# Patient Record
Sex: Female | Born: 1965 | Race: White | Hispanic: No | Smoking: Never smoker
Health system: Southern US, Community
[De-identification: ages and names within clinical notes are randomized; demographics above are authoritative.]

## PROBLEM LIST (undated history)

## (undated) DIAGNOSIS — E079 Disorder of thyroid, unspecified: Secondary | ICD-10-CM

## (undated) DIAGNOSIS — G43909 Migraine, unspecified, not intractable, without status migrainosus: Secondary | ICD-10-CM

## (undated) DIAGNOSIS — E039 Hypothyroidism, unspecified: Secondary | ICD-10-CM

## (undated) HISTORY — PX: PATENT DUCTUS ARTERIOUS REPAIR: SHX269

## (undated) HISTORY — PX: ABDOMINAL HYSTERECTOMY: SHX81

## (undated) HISTORY — PX: ABDOMINAL SURGERY: SHX537

## (undated) HISTORY — PX: HAND SURGERY: SHX662

---

## 2008-06-23 ENCOUNTER — Emergency Department (HOSPITAL_COMMUNITY): Admission: EM | Admit: 2008-06-23 | Discharge: 2008-06-23 | Payer: Self-pay | Admitting: Emergency Medicine

## 2008-07-28 ENCOUNTER — Ambulatory Visit: Payer: Self-pay | Admitting: Internal Medicine

## 2008-12-14 ENCOUNTER — Emergency Department: Payer: Self-pay | Admitting: Emergency Medicine

## 2009-07-29 ENCOUNTER — Ambulatory Visit: Payer: Self-pay | Admitting: Internal Medicine

## 2010-02-23 ENCOUNTER — Ambulatory Visit: Payer: Self-pay | Admitting: Internal Medicine

## 2010-03-01 ENCOUNTER — Ambulatory Visit: Payer: Self-pay | Admitting: Internal Medicine

## 2010-03-27 ENCOUNTER — Ambulatory Visit: Payer: Self-pay | Admitting: Internal Medicine

## 2010-06-17 ENCOUNTER — Emergency Department: Payer: Self-pay | Admitting: Emergency Medicine

## 2010-06-28 ENCOUNTER — Emergency Department (HOSPITAL_COMMUNITY): Admission: EM | Admit: 2010-06-28 | Discharge: 2010-06-28 | Payer: Self-pay | Admitting: Emergency Medicine

## 2010-11-09 ENCOUNTER — Ambulatory Visit: Payer: Self-pay | Admitting: Obstetrics and Gynecology

## 2011-05-06 ENCOUNTER — Emergency Department: Payer: Self-pay | Admitting: Emergency Medicine

## 2011-09-27 LAB — CBC
MCHC: 34.3
MCV: 89.7
Platelets: 269
RDW: 14.6

## 2011-09-27 LAB — DIFFERENTIAL
Basophils Relative: 1
Eosinophils Absolute: 0.1
Eosinophils Relative: 1
Neutrophils Relative %: 62

## 2011-09-27 LAB — BASIC METABOLIC PANEL
BUN: 10
CO2: 23
Chloride: 108
Creatinine, Ser: 0.74
Glucose, Bld: 100 — ABNORMAL HIGH

## 2011-10-14 ENCOUNTER — Emergency Department (HOSPITAL_COMMUNITY)
Admission: EM | Admit: 2011-10-14 | Discharge: 2011-10-14 | Disposition: A | Discharge status: Home / Self Care | Attending: Emergency Medicine | Admitting: Emergency Medicine

## 2011-10-14 ENCOUNTER — Encounter: Payer: Self-pay | Admitting: *Deleted

## 2011-10-14 DIAGNOSIS — E079 Disorder of thyroid, unspecified: Secondary | ICD-10-CM | POA: Insufficient documentation

## 2011-10-14 DIAGNOSIS — T63441A Toxic effect of venom of bees, accidental (unintentional), initial encounter: Secondary | ICD-10-CM

## 2011-10-14 DIAGNOSIS — G43909 Migraine, unspecified, not intractable, without status migrainosus: Secondary | ICD-10-CM | POA: Insufficient documentation

## 2011-10-14 DIAGNOSIS — T6391XA Toxic effect of contact with unspecified venomous animal, accidental (unintentional), initial encounter: Secondary | ICD-10-CM | POA: Insufficient documentation

## 2011-10-14 DIAGNOSIS — T63461A Toxic effect of venom of wasps, accidental (unintentional), initial encounter: Secondary | ICD-10-CM | POA: Insufficient documentation

## 2011-10-14 HISTORY — DX: Disorder of thyroid, unspecified: E07.9

## 2011-10-14 HISTORY — DX: Migraine, unspecified, not intractable, without status migrainosus: G43.909

## 2011-10-14 MED ORDER — PREDNISONE 20 MG PO TABS: 60.0000 mg | ORAL_TABLET | Freq: Every day | ORAL | Status: AC

## 2011-10-14 MED ORDER — EPINEPHRINE 0.3 MG/0.3ML IJ DEVI: 0.3000 mg | INTRAMUSCULAR | Status: AC | PRN

## 2011-10-14 MED ORDER — PREDNISONE 20 MG PO TABS
60.0000 mg | ORAL_TABLET | Freq: Once | ORAL | Status: AC
  Administered 2011-10-14: 60 mg via ORAL
  Filled 2011-10-14: qty 3

## 2011-10-14 NOTE — ED Notes (Signed)
Pt states that she was stung on her right middle finger by a yellow jacket at approximately 1510. Pt gave herself an epipen injection and Benadryl 50 mg po. Pt states that she is having a little tightness in her chest and pain in her finger. Denies difficulty breathing or swallowing.

## 2011-10-14 NOTE — ED Notes (Signed)
Pt a/ox4. Resp even and unlabored. NAD at this time. D/C instructions reviewed with pt. Pt verbalized understanding. Pt ambulated to POV.

## 2011-10-14 NOTE — ED Notes (Signed)
Pt alert and oriented x 3. Skin warm and dry. Color pink. Breath sounds clear and equal bilaterally. Abdomen soft and non distended. C/o tingling in her face and pain and swelling in her right middle finger.

## 2011-10-14 NOTE — ED Provider Notes (Signed)
History  Scribed for Dr. Rubin Payor, the patient was seen in room APA18. The chart was scribed by Gilman Schmidt. The patients care was started at 1630 CSN: [id number] Arrival date & time: 10/14/2011  4:08 PM  Chief Complaint  Patient presents with  . Allergic Reaction    HPI Bridget Fisher is a 45 y.o. female who presents to the Emergency Department complaining of allergic reaction. Pt states she was stung on right middle finger by a yellow jacket approximately 1510. Pt gave herself an epipen injection and Benadryl 50 mg po. Pt states that she is having a little tightness in chest and pain in fingers. Denies difficulty breathing or swallowing. Pt has never been admitted for allergic reaction. States symptoms have alleviated. Denies any heart problems. There are no other associated symptoms and no other alleviating or aggravating factors.   Past Medical History  Diagnosis Date  . Thyroid disease   . Migraines     Past Surgical History  Procedure Date  . Patent ductus arterious repair   . Cesarean section     x 3  . Abdominal hysterectomy   . Abdominal surgery   . Hand surgery     left    History reviewed. No pertinent family history.  History  Substance Use Topics  . Smoking status: Never Smoker   . Smokeless tobacco: Not on file  . Alcohol Use: No    OB History    Grav Para Term Preterm Abortions TAB SAB Ect Mult Living                  Review of Systems  HENT: Negative for trouble swallowing.   Respiratory: Positive for chest tightness. Negative for shortness of breath and wheezing.   Cardiovascular: Negative for chest pain.  Musculoskeletal:       Finger Pain    Allergies  Iohexol; Other; and Sumatriptan  Home Medications  No current outpatient prescriptions on file.  BP 110/56  Pulse 73  Temp(Src) 98.8 F (37.1 C) (Oral)  Resp 16  Ht 5\' 7"  (1.702 m)  Wt 195 lb (88.451 kg)  BMI 30.54 kg/m2  SpO2 97%  Physical Exam  Constitutional: She is oriented to  person, place, and time. She appears well-developed and well-nourished.  Non-toxic appearance. She does not have a sickly appearance.  HENT:  Head: Normocephalic and atraumatic.  Mouth/Throat: No uvula swelling.  Eyes: Conjunctivae, EOM and lids are normal. Pupils are equal, round, and reactive to light. No scleral icterus.  Neck: Trachea normal and normal range of motion. Neck supple.  Cardiovascular: Regular rhythm and normal heart sounds.   Pulmonary/Chest: Effort normal and breath sounds normal.  Abdominal: Soft. Normal appearance. There is no tenderness. There is no rebound, no guarding and no CVA tenderness.  Musculoskeletal: Normal range of motion.       Sting on right medial middle phalange w/ mild edema   Neurological: She is alert and oriented to person, place, and time. She has normal strength.  Skin: Skin is warm, dry and intact. No rash noted.    ED Course  Procedures  DIAGNOSTIC STUDIES: Oxygen Saturation is 97% on room air, normal by my interpretation.    COORDINATION OF CARE: 1630:  - Patient evaluated by ED physician, Deltasone ordered     MDM  Patient came in after an insect staying with an allergic reaction. She has a history of similar allergic reaction to pass. She gave herself an injection with her EpiPen and 50 mg  of Benadryl by mouth for her shortness of breath. The staying at about 3 PM. She feels better on reexamination. No dyspnea I will discharge her with prednisone, and her refill for her EpiPen's.  I personally performed the services described in this documentation, which was scribed in my presence. The recorded information has been reviewed and considered. Juliet Rude. Rubin Payor, MD       Juliet Rude. Rubin Payor, MD 10/14/11 1731

## 2011-11-13 ENCOUNTER — Ambulatory Visit: Payer: Self-pay | Admitting: Internal Medicine

## 2012-01-24 ENCOUNTER — Ambulatory Visit: Payer: Self-pay | Admitting: General Practice

## 2012-03-04 ENCOUNTER — Encounter (HOSPITAL_BASED_OUTPATIENT_CLINIC_OR_DEPARTMENT_OTHER): Payer: Self-pay | Admitting: *Deleted

## 2012-03-10 ENCOUNTER — Ambulatory Visit (HOSPITAL_BASED_OUTPATIENT_CLINIC_OR_DEPARTMENT_OTHER): Payer: Worker's Compensation | Admitting: Anesthesiology

## 2012-03-10 ENCOUNTER — Encounter (HOSPITAL_BASED_OUTPATIENT_CLINIC_OR_DEPARTMENT_OTHER): Payer: Self-pay | Admitting: Anesthesiology

## 2012-03-10 ENCOUNTER — Encounter (HOSPITAL_BASED_OUTPATIENT_CLINIC_OR_DEPARTMENT_OTHER): Payer: Self-pay

## 2012-03-10 ENCOUNTER — Encounter (HOSPITAL_BASED_OUTPATIENT_CLINIC_OR_DEPARTMENT_OTHER): Payer: Self-pay | Admitting: Orthopedic Surgery

## 2012-03-10 ENCOUNTER — Encounter (HOSPITAL_BASED_OUTPATIENT_CLINIC_OR_DEPARTMENT_OTHER)
Admission: RE | Disposition: A | Discharge status: Home / Self Care | Payer: Self-pay | Source: Ambulatory Visit | Attending: Orthopedic Surgery

## 2012-03-10 ENCOUNTER — Ambulatory Visit (HOSPITAL_BASED_OUTPATIENT_CLINIC_OR_DEPARTMENT_OTHER)
Admission: RE | Admit: 2012-03-10 | Discharge: 2012-03-10 | Disposition: A | Discharge status: Home / Self Care | Payer: Worker's Compensation | Source: Ambulatory Visit | Attending: Orthopedic Surgery | Admitting: Orthopedic Surgery

## 2012-03-10 DIAGNOSIS — M23329 Other meniscus derangements, posterior horn of medial meniscus, unspecified knee: Secondary | ICD-10-CM

## 2012-03-10 DIAGNOSIS — M129 Arthropathy, unspecified: Secondary | ICD-10-CM | POA: Insufficient documentation

## 2012-03-10 DIAGNOSIS — E039 Hypothyroidism, unspecified: Secondary | ICD-10-CM | POA: Insufficient documentation

## 2012-03-10 DIAGNOSIS — J45909 Unspecified asthma, uncomplicated: Secondary | ICD-10-CM | POA: Insufficient documentation

## 2012-03-10 DIAGNOSIS — R51 Headache: Secondary | ICD-10-CM | POA: Insufficient documentation

## 2012-03-10 DIAGNOSIS — Z01812 Encounter for preprocedural laboratory examination: Secondary | ICD-10-CM | POA: Insufficient documentation

## 2012-03-10 DIAGNOSIS — M2240 Chondromalacia patellae, unspecified knee: Secondary | ICD-10-CM | POA: Insufficient documentation

## 2012-03-10 DIAGNOSIS — M23359 Other meniscus derangements, posterior horn of lateral meniscus, unspecified knee: Secondary | ICD-10-CM | POA: Insufficient documentation

## 2012-03-10 HISTORY — PX: MENISECTOMY: SHX5181

## 2012-03-10 HISTORY — PX: CHONDROPLASTY: SHX5177

## 2012-03-10 HISTORY — DX: Hypothyroidism, unspecified: E03.9

## 2012-03-10 LAB — POCT HEMOGLOBIN-HEMACUE: Hemoglobin: 13.5 g/dL (ref 12.0–15.0)

## 2012-03-10 SURGERY — ARTHROSCOPY, KNEE, WITH MEDIAL MENISCECTOMY
Anesthesia: General | Site: Knee | Laterality: Right | Wound class: Clean

## 2012-03-10 MED ORDER — LIDOCAINE HCL (CARDIAC) 20 MG/ML IV SOLN
INTRAVENOUS | Status: DC | PRN
  Administered 2012-03-10: 40 mg via INTRAVENOUS

## 2012-03-10 MED ORDER — SODIUM CHLORIDE 0.9 % IR SOLN
Status: DC | PRN
  Administered 2012-03-10: 14:00:00

## 2012-03-10 MED ORDER — DEXAMETHASONE SODIUM PHOSPHATE 4 MG/ML IJ SOLN
INTRAMUSCULAR | Status: DC | PRN
  Administered 2012-03-10: 10 mg via INTRAVENOUS

## 2012-03-10 MED ORDER — HYDROCODONE-ACETAMINOPHEN 5-325 MG PO TABS: 1.0000 | ORAL_TABLET | ORAL | Status: DC | PRN

## 2012-03-10 MED ORDER — HYDROMORPHONE HCL PF 1 MG/ML IJ SOLN
0.2500 mg | INTRAMUSCULAR | Status: DC | PRN
  Administered 2012-03-10 (×2): 0.5 mg via INTRAVENOUS

## 2012-03-10 MED ORDER — SUCCINYLCHOLINE CHLORIDE 20 MG/ML IJ SOLN
INTRAMUSCULAR | Status: DC | PRN
  Administered 2012-03-10: 100 mg via INTRAVENOUS

## 2012-03-10 MED ORDER — PROPOFOL 10 MG/ML IV EMUL
INTRAVENOUS | Status: DC | PRN
  Administered 2012-03-10: 50 mg via INTRAVENOUS
  Administered 2012-03-10: 200 mg via INTRAVENOUS

## 2012-03-10 MED ORDER — MIDAZOLAM HCL 5 MG/5ML IJ SOLN
INTRAMUSCULAR | Status: DC | PRN
  Administered 2012-03-10: 2 mg via INTRAVENOUS

## 2012-03-10 MED ORDER — LACTATED RINGERS IV SOLN
INTRAVENOUS | Status: DC
  Administered 2012-03-10 (×2): via INTRAVENOUS

## 2012-03-10 MED ORDER — BUPIVACAINE-EPINEPHRINE 0.5% -1:200000 IJ SOLN
INTRAMUSCULAR | Status: DC | PRN
  Administered 2012-03-10: 20 mL

## 2012-03-10 MED ORDER — ONDANSETRON HCL 4 MG/2ML IJ SOLN
INTRAMUSCULAR | Status: DC | PRN
  Administered 2012-03-10: 4 mg via INTRAVENOUS

## 2012-03-10 MED ORDER — FENTANYL CITRATE 0.05 MG/ML IJ SOLN
INTRAMUSCULAR | Status: DC | PRN
  Administered 2012-03-10 (×3): 50 ug via INTRAVENOUS

## 2012-03-10 MED ORDER — HYDROCODONE-ACETAMINOPHEN 5-325 MG PO TABS: 1.0000 | ORAL_TABLET | ORAL | Status: AC | PRN

## 2012-03-10 MED ORDER — CEFAZOLIN SODIUM 1-5 GM-% IV SOLN
INTRAVENOUS | Status: DC | PRN
  Administered 2012-03-10: 2 g via INTRAVENOUS

## 2012-03-10 SURGICAL SUPPLY — 39 items
BANDAGE ELASTIC 6 VELCRO ST LF (GAUZE/BANDAGES/DRESSINGS) ×2 IMPLANT
BLADE 4.2CUDA (BLADE) IMPLANT
BLADE CUTTER GATOR 3.5 (BLADE) ×1 IMPLANT
BLADE GREAT WHITE 4.2 (BLADE) IMPLANT
CANISTER OMNI JUG 16 LITER (MISCELLANEOUS) ×1 IMPLANT
CANISTER SUCTION 2500CC (MISCELLANEOUS) IMPLANT
CHLORAPREP W/TINT 26ML (MISCELLANEOUS) ×2 IMPLANT
CLOTH BEACON ORANGE TIMEOUT ST (SAFETY) ×2 IMPLANT
DRAPE ARTHROSCOPY W/POUCH 114 (DRAPES) ×2 IMPLANT
ELECT MENISCUS 165MM 90D (ELECTRODE) IMPLANT
ELECT REM PT RETURN 9FT ADLT (ELECTROSURGICAL) ×2
ELECTRODE REM PT RTRN 9FT ADLT (ELECTROSURGICAL) IMPLANT
GAUZE XEROFORM 1X8 LF (GAUZE/BANDAGES/DRESSINGS) ×2 IMPLANT
GLOVE BIO SURGEON STRL SZ 6.5 (GLOVE) ×1 IMPLANT
GLOVE BIO SURGEON STRL SZ7 (GLOVE) ×2 IMPLANT
GLOVE BIO SURGEON STRL SZ7.5 (GLOVE) ×2 IMPLANT
GLOVE BIOGEL PI IND STRL 7.0 (GLOVE) ×1 IMPLANT
GLOVE BIOGEL PI IND STRL 8 (GLOVE) ×1 IMPLANT
GLOVE BIOGEL PI INDICATOR 7.0 (GLOVE) ×2
GLOVE BIOGEL PI INDICATOR 8 (GLOVE) ×1
GOWN PREVENTION PLUS XLARGE (GOWN DISPOSABLE) ×4 IMPLANT
KNEE WRAP E Z 3 GEL PACK (MISCELLANEOUS) ×2 IMPLANT
NDL FILTER BLUNT 18X1 1/2 (NEEDLE) ×1 IMPLANT
NDL SAFETY ECLIPSE 18X1.5 (NEEDLE) IMPLANT
NEEDLE FILTER BLUNT 18X 1/2SAF (NEEDLE)
NEEDLE FILTER BLUNT 18X1 1/2 (NEEDLE) IMPLANT
NEEDLE HYPO 18GX1.5 SHARP (NEEDLE)
PACK ARTHROSCOPY DSU (CUSTOM PROCEDURE TRAY) ×2 IMPLANT
PACK BASIN DAY SURGERY FS (CUSTOM PROCEDURE TRAY) ×2 IMPLANT
PENCIL BUTTON HOLSTER BLD 10FT (ELECTRODE) IMPLANT
SET ARTHROSCOPY TUBING (MISCELLANEOUS) ×2
SET ARTHROSCOPY TUBING LN (MISCELLANEOUS) ×1 IMPLANT
SLEEVE SCD COMPRESS KNEE MED (MISCELLANEOUS) ×1 IMPLANT
SPONGE GAUZE 4X4 12PLY (GAUZE/BANDAGES/DRESSINGS) ×2 IMPLANT
SYR 3ML 18GX1 1/2 (SYRINGE) ×1 IMPLANT
SYR 5ML LL (SYRINGE) ×1 IMPLANT
TOWEL OR 17X24 6PK STRL BLUE (TOWEL DISPOSABLE) ×2 IMPLANT
WAND STAR VAC 90 (SURGICAL WAND) IMPLANT
WATER STERILE IRR 1000ML POUR (IV SOLUTION) ×2 IMPLANT

## 2012-03-10 NOTE — Discharge Instructions (Addendum)
Post Anesthesia Home Care Instructions  Activity: Get plenty of rest for the remainder of the day. A responsible adult should stay with you for 24 hours following the procedure.  For the next 24 hours, DO NOT: -Drive a car -Advertising copywriter -Drink alcoholic beverages -Take any medication unless instructed by your physician -Make any legal decisions or sign important papers.  Meals: Start with liquid foods such as gelatin or soup. Progress to regular foods as tolerated. Avoid greasy, spicy, heavy foods. If nausea and/or vomiting occur, drink only clear liquids until the nausea and/or vomiting subsides. Call your physician if vomiting continues.  Special Instructions/Symptoms: Your throat may feel dry or sore from the anesthesia or the breathing tube placed in your throat during surgery. If this causes discomfort, gargle with warm salt water. The discomfort should disappear within 24 hours.    Call your surgeon if you experience:   1.  Fever over 101.0. 2.  Inability to urinate. 3.  Nausea and/or vomiting. 4.  Extreme swelling or bruising at the surgical site. 5.  Continued bleeding from the incision. 6.  Increased pain, redness or drainage from the incision. 7.  Problems related to your pain medication.    Discharge Instructions After Orthopedic Procedures:  *You may feel tired and weak following your procedure. It is recommended that you limit physical activity for the next 24 hours and rest at home for the remainder of today and tomorrow. *No strenuous activity should be started without your doctor's permission.  Elevate the extremity that you had surgery on to a level above your heart. This should continue for 48 hours or as instructed by your doctor.  If you had hand, arm or shoulder surgery you should move your fingers frequently unless otherwise instructed by your doctor.  If you had foot, knee or leg surgery you should wiggle your toes frequently unless otherwise  instructed by your doctor.  Follow your doctor's exact instructions for activity at home. Use your home equipment as instructed. (Crutches, hard shoes, slings etc.)  Limit your activity as instructed by your doctor.  Report to your doctor should any of the following occur: 1. Extreme swelling of your fingers or toes. 2. Inability to wiggle your fingers or toes. 3. Coldness, pale or bluish color in your fingers or toes. 4. Loss of sensation, numbness or tingling of your fingers or toes. 5. Unusual smell or odor from under your dressing or cast. 6. Excessive bleeding or drainage from the surgical site. 7. Pain not relieved by medication your doctor has prescribed for you. 8. Cast or dressing too tight (do not get your dressing or cast wet or put anything under your dressing or cast.)  *Do not change your dressing unless instructed by your doctor or discharge nurse. Then follow exact instructions.  *Follow labeled instructions for any medications that your doctor may have prescribed for you. *Should any questions or complications develop following your procedure, PLEASE CONTACT YOUR DOCTOR.

## 2012-03-10 NOTE — Interval H&P Note (Signed)
History and Physical Interval Note:  03/10/2012 1:35 PM  Bridget Fisher  has presented today for surgery, with the diagnosis of right knee mmt  The various methods of treatment have been discussed with the patient and family. After consideration of risks, benefits and other options for treatment, the patient has consented to  Procedure(s) (LRB): KNEE ARTHROSCOPY WITH MEDIAL MENISECTOMY (Right) as a surgical intervention .  The patients' history has been reviewed, patient examined, no change in status, stable for surgery.  I have reviewed the patients' chart and labs.  Questions were answered to the patient's satisfaction.     Nestor Lewandowsky

## 2012-03-10 NOTE — Anesthesia Procedure Notes (Signed)
Procedure Name: Intubation Date/Time: 03/10/2012 2:01 PM Performed by: Burna Cash Pre-anesthesia Checklist: Patient identified, Emergency Drugs available, Suction available and Patient being monitored Patient Re-evaluated:Patient Re-evaluated prior to inductionOxygen Delivery Method: Circle System Utilized Preoxygenation: Pre-oxygenation with 100% oxygen Intubation Type: IV induction Ventilation: Mask ventilation without difficulty Laryngoscope Size: Mac and 3 Grade View: Grade I Tube type: Oral Tube size: 7.0 mm Number of attempts: 1 Airway Equipment and Method: stylet and oral airway Placement Confirmation: ETT inserted through vocal cords under direct vision,  positive ETCO2 and breath sounds checked- equal and bilateral Secured at: 21 cm Tube secured with: Tape Dental Injury: Teeth and Oropharynx as per pre-operative assessment

## 2012-03-10 NOTE — Op Note (Signed)
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   Surgeon: Feliberto Gottron. Turner Daniels M.D.  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 She's had persistent catching popping and pain. 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 Pt has failed conservative treatment with anti-inflammatory medicines, physical therapy, and modified activites but did get good temporarily from an intra-articular cortisone injection. Pain has recurred and patient desires elective arthroscopic evaluation and treatment of knee. MRI scan confirms the above diagnosis. Risks and benefits of surgery have been discussed and questions answered.  Procedure: Patient identified by arm band and taken to the operating room at the day surgery Center. The appropriate anesthetic monitors were attached, and General LMA anesthesia was induced without difficulty. Lateral post was applied to the table and the lower extremity was prepped and draped in usual sterile fashion from the ankle to the midthigh. Time out procedure was performed. We began the operation by making standard inferior lateral and inferior medial peripatellar portals with a #11 blade allowing introduction of the arthroscope through the inferior lateral portal and the out flow to the inferior medial portal. Pump pressure was set at 100 mmHg and diagnostic arthroscopy  revealed posterior horn medial and lateral meniscal tears were  debrided with a 3.5 mm Gator sucker shaver. Grade 3 chondromalacia with flap tears of the trochlea, medial femoral condyle, and lateral tibial plateau was also identified and debrided with the 3.5 mm Gator sucker shaver. Small bits of articular cartilage were also continuously removed and the joint fluid throughout the procedure. The gutters were cleared medially and laterally and the arthroscopic incisions removed.. The knee was irrigated out normal saline solution. A dressing of xerofoam 4 x 4 dressing sponges, web roll and an Ace wrap was applied. The patient was awakened extubated and taken to the recovery without difficulty.    Signed: Nestor Lewandowsky, MD

## 2012-03-10 NOTE — Anesthesia Preprocedure Evaluation (Signed)
Anesthesia Evaluation  Patient identified by MRN, date of birth, ID band Patient awake    Reviewed: Allergy & Precautions, H&P , NPO status , Patient's Chart, lab work & pertinent test results  History of Anesthesia Complications Negative for: history of anesthetic complications  Airway Mallampati: I TM Distance: >3 FB Neck ROM: Full    Dental No notable dental hx. 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) Rhythm:Regular Rate:Normal     Neuro/Psych  Headaches, negative neurological ROS     GI/Hepatic negative GI ROS, Neg liver ROS,   Endo/Other  Hypothyroidism (medicated) Morbid obesity  Renal/GU negative Renal ROS     Musculoskeletal  (+) Arthritis -,   Abdominal (+) + obese,   Peds  Hematology negative hematology ROS (+)   Anesthesia Other Findings   Reproductive/Obstetrics                           Anesthesia Physical Anesthesia Plan  ASA: II  Anesthesia Plan: General   Post-op Pain Management:    Induction: Intravenous  Airway Management Planned: LMA  Additional Equipment:   Intra-op Plan:   Post-operative Plan:   Informed Consent: I have reviewed the patients History and Physical, chart, labs and discussed the procedure including the risks, benefits and alternatives for the proposed anesthesia with the patient or authorized representative who has indicated his/her understanding and acceptance.   Dental advisory given  Plan Discussed with: CRNA and Surgeon  Anesthesia Plan Comments: (Plan routine monitors, GA- LMA ok)        Anesthesia Quick Evaluation

## 2012-03-10 NOTE — H&P (Signed)
HPI: Patient presents with a chief complaint of right knee pain that began acutely on 06/02/11 when she was pushing a stretcher around a corner with a 450 pound patient on it.  She reports that her knee popped.  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  She complains of popping and catching and pain on the medial side of her knee.  She had an MRI scan demonstrating a medial meniscal tear that she has brought today for our review.  All: Sumatriptan and contrast dye  ROS: 14 point review of systems form filled out by the patient was reviewed and was negative as it relates to the history of present illness except for: Asthma and headaches  PMH:  Migraines and hypothyroidism  Past surgical history: Hysterectomy, 3 C-sections, and hand surgery  FHx: Noncontributory  SocHx: Denies the use of tobacco or alcohol.  Divorced.  Recently finished nursing school.  PE: Well-developed, well-nourished 46 year old female who is alert and oriented and in no acute distress.  She is 5 feet 7 inches tall and weighs 200 pounds.  Examination of the right knee demonstrates tenderness to palpation along the medial joint line.  She has pain with full extension and flexion past 120.  McMurray's test is positive.  She is neurovascularly intact.  Imaging/Tests: MRI of the right knee demonstrates a posterior horn medial meniscal tear  Assess: Right knee medial meniscus tear  Plan: Ms. Kowaleski has failed approximately 8 months of conservative treatment, so we would like to get permission from her insurance carrier for arthroscopic decompression of the knee.  Her goal is to get back to work as quickly as possible after surgery, so we'll likely place her in formal physical therapy postoperatively.  If in the meantime she was given a regular duty work note.

## 2012-03-10 NOTE — Transfer of Care (Signed)
Immediate Anesthesia Transfer of Care Note  Patient: Bridget Fisher  Procedure(s) Performed: Procedure(s) (LRB): KNEE ARTHROSCOPY WITH MEDIAL MENISECTOMY (Right) MENISECTOMY (Right) CHONDROPLASTY (Right)  Patient Location: PACU  Anesthesia Type: General  Level of Consciousness: awake, alert  and oriented  Airway & Oxygen Therapy: Patient Spontanous Breathing and Patient connected to nasal cannula oxygen  Post-op Assessment: Report given to PACU RN and Post -op Vital signs reviewed and stable  Post vital signs: Reviewed and stable  Complications: No apparent anesthesia complications

## 2012-03-10 NOTE — Anesthesia Postprocedure Evaluation (Signed)
  Anesthesia Post-op Note  Patient: Bridget Fisher  Procedure(s) Performed: Procedure(s) (LRB): KNEE ARTHROSCOPY WITH MEDIAL MENISECTOMY (Right) MENISECTOMY (Right) CHONDROPLASTY (Right)  Patient Location: PACU  Anesthesia Type: General  Level of Consciousness: awake, alert  and oriented  Airway and Oxygen Therapy: Patient Spontanous Breathing  Post-op Pain: none  Post-op Assessment: Post-op Vital signs reviewed, Patient's Cardiovascular Status Stable, Respiratory Function Stable, Patent Airway, No signs of Nausea or vomiting and Pain level controlled  Post-op Vital Signs: Reviewed and stable  Complications: No apparent anesthesia complications

## 2012-03-12 ENCOUNTER — Encounter (HOSPITAL_BASED_OUTPATIENT_CLINIC_OR_DEPARTMENT_OTHER): Payer: Self-pay | Admitting: Orthopedic Surgery

## 2012-06-25 ENCOUNTER — Emergency Department: Payer: Self-pay | Admitting: *Deleted

## 2012-11-07 ENCOUNTER — Emergency Department: Payer: Self-pay | Admitting: *Deleted

## 2013-07-08 ENCOUNTER — Ambulatory Visit: Payer: Self-pay | Admitting: Internal Medicine

## 2013-07-26 ENCOUNTER — Emergency Department: Payer: Self-pay | Admitting: Emergency Medicine

## 2014-06-10 ENCOUNTER — Ambulatory Visit: Payer: Self-pay | Admitting: Obstetrics and Gynecology

## 2015-08-24 ENCOUNTER — Encounter: Payer: Self-pay | Admitting: *Deleted

## 2016-09-28 ENCOUNTER — Other Ambulatory Visit: Payer: Self-pay | Admitting: Nurse Practitioner

## 2016-09-28 ENCOUNTER — Other Ambulatory Visit: Payer: Self-pay | Admitting: Obstetrics and Gynecology

## 2016-09-28 DIAGNOSIS — Z1231 Encounter for screening mammogram for malignant neoplasm of breast: Secondary | ICD-10-CM

## 2016-10-25 ENCOUNTER — Ambulatory Visit

## 2016-11-15 ENCOUNTER — Ambulatory Visit: Admission: RE | Admit: 2016-11-15 | Source: Ambulatory Visit

## 2017-12-11 ENCOUNTER — Other Ambulatory Visit: Payer: Self-pay | Admitting: Student

## 2017-12-11 DIAGNOSIS — Z0000 Encounter for general adult medical examination without abnormal findings: Secondary | ICD-10-CM

## 2018-01-01 ENCOUNTER — Other Ambulatory Visit: Payer: Self-pay | Admitting: Family Medicine

## 2018-01-01 DIAGNOSIS — Z1231 Encounter for screening mammogram for malignant neoplasm of breast: Secondary | ICD-10-CM

## 2018-01-17 ENCOUNTER — Ambulatory Visit
Admission: RE | Admit: 2018-01-17 | Discharge: 2018-01-17 | Disposition: A | Discharge status: Home / Self Care | Source: Ambulatory Visit | Attending: Family Medicine | Admitting: Family Medicine

## 2018-01-17 DIAGNOSIS — Z1231 Encounter for screening mammogram for malignant neoplasm of breast: Secondary | ICD-10-CM | POA: Diagnosis not present

## 2018-02-04 ENCOUNTER — Telehealth: Payer: Self-pay | Admitting: Pulmonary Disease

## 2018-02-04 NOTE — Telephone Encounter (Signed)
Spoke with patient. She has been scheduled for tomorrow 02/05/18 at 12pm. She is aware of appt. Nothing else needed at time of call.

## 2018-02-04 NOTE — Telephone Encounter (Signed)
RA wants to see the patient tomorrow 02/05/18 at 12pm as a new patient. Left message for patient to call back to get her scheduled.

## 2018-02-04 NOTE — Telephone Encounter (Signed)
Pt is calling back about the appt. [phone]

## 2018-02-05 ENCOUNTER — Ambulatory Visit (INDEPENDENT_AMBULATORY_CARE_PROVIDER_SITE_OTHER): Admitting: Pulmonary Disease

## 2018-02-05 ENCOUNTER — Other Ambulatory Visit (INDEPENDENT_AMBULATORY_CARE_PROVIDER_SITE_OTHER)

## 2018-02-05 ENCOUNTER — Encounter: Payer: Self-pay | Admitting: Pulmonary Disease

## 2018-02-05 VITALS — BP 124/68 | HR 77 | Ht 67.0 in | Wt 185.5 lb

## 2018-02-05 DIAGNOSIS — J4540 Moderate persistent asthma, uncomplicated: Secondary | ICD-10-CM

## 2018-02-05 LAB — CBC WITH DIFFERENTIAL/PLATELET
BASOS PCT: 0.8 % (ref 0.0–3.0)
Basophils Absolute: 0.1 10*3/uL (ref 0.0–0.1)
EOS PCT: 1.2 % (ref 0.0–5.0)
Eosinophils Absolute: 0.1 10*3/uL (ref 0.0–0.7)
HCT: 40.3 % (ref 36.0–46.0)
HEMOGLOBIN: 13.3 g/dL (ref 12.0–15.0)
Lymphocytes Relative: 41.7 % (ref 12.0–46.0)
Lymphs Abs: 3.3 10*3/uL (ref 0.7–4.0)
MCHC: 33.1 g/dL (ref 30.0–36.0)
MCV: 87.8 fl (ref 78.0–100.0)
Monocytes Absolute: 0.8 10*3/uL (ref 0.1–1.0)
Monocytes Relative: 9.6 % (ref 3.0–12.0)
Neutro Abs: 3.7 10*3/uL (ref 1.4–7.7)
Neutrophils Relative %: 46.7 % (ref 43.0–77.0)
Platelets: 258 10*3/uL (ref 150.0–400.0)
RBC: 4.58 Mil/uL (ref 3.87–5.11)
RDW: 14.6 % (ref 11.5–15.5)
WBC: 7.9 10*3/uL (ref 4.0–10.5)

## 2018-02-05 LAB — NITRIC OXIDE: Nitric Oxide: 17

## 2018-02-05 MED ORDER — MOMETASONE FURO-FORMOTEROL FUM 200-5 MCG/ACT IN AERO: 2.0000 | INHALATION_SPRAY | Freq: Two times a day (BID) | RESPIRATORY_TRACT | 0 refills | Status: DC

## 2018-02-05 NOTE — Patient Instructions (Signed)
Lung function is normal. Trial of Symbicort 160-2 puffs twice daily, rinse mouth after use. Call for prescription if this works within 2 weeks  Continue to use albuterol on an as-needed basis.  Blood work today for allergy panel

## 2018-02-05 NOTE — Progress Notes (Signed)
Subjective:     Patient ID: Bridget GreathouseShamille A Banbury, female   DOB: [birth year], 52 y.o.   MRN: [id number]  HPI   Chief Complaint  Patient presents with  . 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 She reports onset of asthma in her 30s which manifested as acute onset of wheezing on exposure to strong perfumes, cleaning products or strong smells.  She initially only required albuterol for intermittent symptoms but in her mid 5340s she still requiring Advair which she took for a few years but then this stopped working.  Her PCP tried her on Brio a few weeks ago but this did not seem to help. For the past few weeks she reports worsening dyspnea and intermittent wheezing and use of albuterol at least twice daily.  She reports seasonal allergies worse in the spring and fall for which she takes over-the-counter antihistaminic. 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  She does have a nebulizer at home which she uses a little as-needed basis.  She denies significant heartburn or sinus congestion or drip.   Environment -she lives with her fianc in old 3 bedroom house single level, hardwoods and carpets, has a Government social research officerdehumidifier, dogs outside, no new pillows , bedsheet or linen  Mother has asthma, family history of heart disease  She is a lifetime never smoker   Spirometry shows normal lung function today FENO 17  Past Medical History:  Diagnosis Date  . Asthma   . Hypothyroidism   . Migraines   . Thyroid disease      Allergies  Allergen Reactions  . Contrast Media [Iodinated Diagnostic Agents] Shortness Of Breath  . Methylprednisolone Anaphylaxis  . Other Anaphylaxis    Bee stings  . Sumatriptan Other (See Comments)    FELT LIKE SHE WAS "JUMPING OUT OF HER SKIN"    Social History    Socioeconomic History  . Marital status: Divorced    Spouse name: Not on file  . Number of children: Not on file  . Years of education: Not on file  . Highest education level: Not on file  Social Needs  . Financial resource strain: Not on file  . Food insecurity - worry: Not on file  . Food insecurity - inability: Not on file  . Transportation needs - medical: Not on file  . Transportation needs - non-medical: Not on file  Occupational History  . Not on file  Tobacco Use  . Smoking status: Never Smoker  . Smokeless tobacco: Never Used  Substance and Sexual Activity  . Alcohol use: No  . Drug use: No  . Sexual activity: Not on file  Other Topics Concern  . Not on file  Social History Narrative  . Not on file     Review of Systems  Constitutional: Positive for unexpected weight change. Negative for fever.  HENT: Negative for congestion, dental problem, ear pain, nosebleeds, postnasal drip, rhinorrhea, sinus pressure, sneezing, sore throat and trouble swallowing.   Eyes: Negative for redness and itching.  Respiratory: Positive for cough and shortness of breath. Negative for chest tightness and wheezing.   Cardiovascular: Negative for palpitations and leg swelling.  Gastrointestinal: Negative for nausea and vomiting.  Genitourinary: Negative for dysuria.  Musculoskeletal: Negative for joint swelling.  Skin: Negative for rash.  Neurological: Negative for headaches.  Hematological: Does not bruise/bleed easily.  Psychiatric/Behavioral: Negative for dysphoric mood. The patient is not nervous/anxious.        Objective:   Physical Exam  Gen. Pleasant, well-nourished, in no distress, normal affect ENT - no lesions, no post nasal drip Neck: No JVD, no thyromegaly, no carotid bruits Lungs: no use of accessory muscles, no dullness to percussion, clear without rales or rhonchi  Cardiovascular: Rhythm regular, heart sounds  normal, no murmurs or gallops, no peripheral  edema Abdomen: soft and non-tender, no hepatosplenomegaly, BS normal. Musculoskeletal: No deformities, no cyanosis or clubbing Neuro:  alert, non focal     Assessment:         Plan:

## 2018-02-05 NOTE — Assessment & Plan Note (Signed)
She does seem to have moderate persistent asthma with recent worsening which may be related to environmental exposure to cleaning products.  She also seems to be worse on her days off Spirometry is normal today. We will step up treatment to Symbicort 162 puffs twice daily.  She will continue using albuterol on an as-needed basis. We will also obtain RAST and CBC with differential to establish baseline profile

## 2018-02-06 LAB — RESPIRATORY ALLERGY PROFILE REGION II ~~LOC~~
Allergen, A. alternata, m6: <0.1 kU/L
Allergen, Comm Silver Birch, t9: <0.1 kU/L
Allergen, D pternoyssinus,d7: 0.8 kU/L — ABNORMAL HIGH
Allergen, Mouse Urine Protein, e78: <0.1 kU/L
Allergen, Oak,t7: <0.1 kU/L
Box Elder IgE: <0.1 kU/L
CLASS: 0
CLASS: 0
CLASS: 0
CLASS: 0
CLASS: 0
CLASS: 0
CLASS: 0
CLASS: 0
CLASS: 0
CLASS: 0
CLASS: 0
Class: 0
Class: 0
Class: 0
Class: 0
Class: 0
Class: 0
Class: 0
Class: 0
Class: 0
Class: 0
Class: 0
Class: 1
Class: 2
Cockroach: <0.1 kU/L
D. farinae: 0.48 kU/L — ABNORMAL HIGH
Dog Dander: <0.1 kU/L
Elm IgE: <0.1 kU/L
IgE (Immunoglobulin E), Serum: 9 kU/L (ref <=114)
Johnson Grass: <0.1 kU/L
Timothy Grass: <0.1 kU/L

## 2018-02-06 LAB — INTERPRETATION:

## 2018-02-07 ENCOUNTER — Telehealth: Payer: Self-pay | Admitting: Pulmonary Disease

## 2018-02-07 NOTE — Telephone Encounter (Signed)
Spoke with pt, she started [facility] and she wants to try a lower dose of her inhaler because she thinks it is giving her side effects that are causing her to cry about everything. She states this is not like her and thinks it has something to do with the steroid in the inhaler. RA please advise.

## 2018-02-10 MED ORDER — MOMETASONE FURO-FORMOTEROL FUM 100-5 MCG/ACT IN AERO: 2.0000 | INHALATION_SPRAY | Freq: Two times a day (BID) | RESPIRATORY_TRACT | 6 refills | Status: DC

## 2018-02-10 NOTE — Telephone Encounter (Signed)
OK to change to dulera 100

## 2018-02-10 NOTE — Telephone Encounter (Signed)
Called pt letting her know we were switching her to the lower dose of dulera (dulera 100) to see if that would be better for her.  Verified which pharmacy pt wanted me to send Rx to.  Rx sent. Nothing further needed at this current time.

## 2018-04-07 ENCOUNTER — Other Ambulatory Visit: Payer: Self-pay

## 2018-04-07 MED ORDER — MOMETASONE FURO-FORMOTEROL FUM 100-5 MCG/ACT IN AERO: 2.0000 | INHALATION_SPRAY | Freq: Two times a day (BID) | RESPIRATORY_TRACT | 2 refills | Status: AC

## 2018-05-16 ENCOUNTER — Encounter: Payer: Self-pay | Admitting: Adult Health

## 2018-05-16 ENCOUNTER — Ambulatory Visit (INDEPENDENT_AMBULATORY_CARE_PROVIDER_SITE_OTHER): Admitting: Adult Health

## 2018-05-16 DIAGNOSIS — J301 Allergic rhinitis due to pollen: Secondary | ICD-10-CM | POA: Diagnosis not present

## 2018-05-16 DIAGNOSIS — J4540 Moderate persistent asthma, uncomplicated: Secondary | ICD-10-CM | POA: Diagnosis not present

## 2018-05-16 DIAGNOSIS — J309 Allergic rhinitis, unspecified: Secondary | ICD-10-CM | POA: Insufficient documentation

## 2018-05-16 NOTE — Assessment & Plan Note (Signed)
Asthma - no flare of cough /wheezing off maintenance inhalers. Pt education given  Discussed action plan to add Dulera back .  Control for triggers   Plan  Patient Instructions  Limit Advil use . - discuss with Primary elevated blood pressure .  May remain of [facility] for now, use if symptoms -wheezing /cough restart .  Use Albuterol As needed  , if start to use more than 2x a week, need to restart Dulera .  Claritin  daily As needed  Drainage  Continue on Singulair daily.  Follow up with Dr. Vassie Loll  In 4-6 months and As needed

## 2018-05-16 NOTE — Patient Instructions (Addendum)
Limit Advil use . - discuss with Primary elevated blood pressure .  May remain of [facility] for now, use if symptoms -wheezing /cough restart .  Use Albuterol As needed  , if start to use more than 2x a week, need to restart Dulera .  Claritin  daily As needed  Drainage  Continue on Singulair daily.  Follow up with Dr. Vassie Loll  In 4-6 months and As needed

## 2018-05-16 NOTE — Assessment & Plan Note (Signed)
Control for triggers   Plan  Patient Instructions  Limit Advil use . - discuss with Primary elevated blood pressure .  May remain of [facility] for now, use if symptoms -wheezing /cough restart .  Use Albuterol As needed  , if start to use more than 2x a week, need to restart Dulera .  Claritin  daily As needed  Drainage  Continue on Singulair daily.  Follow up with Dr. Vassie Loll  In 4-6 months and As needed

## 2018-05-16 NOTE — Progress Notes (Signed)
  ID: Bridget Fisher, female    DOB: February 18, 1966, 52 y.o.   MRN: [id number]  Chief Complaint  Patient presents with  . Follow-up    Asthma    Referring provider: Armando Gang, FNP  HPI: 52 year old female never smoker seen for pulmonary consult February 05, 2018 to establish for asthma CCU RN at [facility]  TEST  01/2018 >Spirometry shows normal lung function today FENO 17  05/16/2018 Follow up : Asthma  Patient returns for a 66-month follow-up.  Patient was seen last visit to establish care for her asthma.  Patient had had some frequent asthma exacerbations.  She was placed on Dulera 200.  Patient says she was unable to tolerate this and had significant mood swings.  She was changed to [facility] 100 twice daily.  Patient says it did help her breathing she felt like her asthma got under better control.  She had less wheezing cough and shortness of breath.  Also had decreased albuterol use.  Patient says she weaned herself off about 3 weeks ago.  She has had no flare of cough or wheezing.  And no increased albuterol use.  Patient wishes to stay off of this if possible.  Says that since being on this on a consistent basis she has gained 20 pounds.  We went over patient education for asthma along with an asthma action plan and potential applications of untreated asthma.  She also worried that it was causing her blood pressure to be up however she has been off of Dulera for 3 weeks and blood pressure has remained elevated in the 140s.  She does tell me that she is also been taking ibuprofen consistently over the last couple months. Denies any headache visual changes or increased edema Allergies  Allergen Reactions  . Contrast Media [Iodinated Diagnostic Agents] Shortness Of Breath  . Methylprednisolone Anaphylaxis  . Other Anaphylaxis    Bee stings  . Sumatriptan Other (See Comments)    FELT LIKE SHE WAS "JUMPING OUT OF HER SKIN"    Immunization History  Administered Date(s)  Administered  . Influenza Whole 09/23/2017    Past Medical History:  Diagnosis Date  . Asthma   . Hypothyroidism   . Migraines   . Thyroid disease     Tobacco History: Social History   Tobacco Use  Smoking Status Never Smoker  Smokeless Tobacco Never Used   Counseling given: Not Answered   Outpatient Encounter Medications as of 05/16/2018  Medication Sig  . albuterol (PROVENTIL HFA;VENTOLIN HFA) 108 (90 BASE) MCG/ACT inhaler Inhale 2 puffs into the lungs every 6 (six) hours as needed.  . Calcium Carbonate-Vitamin D (CALCIUM-VITAMIN D) 500-200 MG-UNIT per tablet Take 1 tablet by mouth 2 (two) times daily with a meal.  . EPINEPHrine (EPIPEN) 0.3 mg/0.3 mL DEVI Inject 0.3 mLs (0.3 mg total) into the muscle as needed.  Marland Kitchen estradiol (ESTRACE) 2 MG tablet Take 2 mg by mouth daily.  Marland Kitchen levothyroxine (SYNTHROID, LEVOTHROID) 100 MCG tablet Take 25 mcg by mouth daily.   . Multiple Vitamin (MULITIVITAMIN WITH MINERALS) TABS Take 1 tablet by mouth daily.  Marland Kitchen thyroid (ARMOUR THYROID) 90 MG tablet Take 90 mg by mouth daily.  . mometasone-formoterol (DULERA) 100-5 MCG/ACT AERO Inhale 2 puffs into the lungs 2 (two) times daily. (Patient not taking: Reported on 05/16/2018)   No facility-administered encounter medications on file as of 05/16/2018.      Review of Systems  Constitutional:   No  weight loss, night sweats,  Fevers, chills, fatigue, or  lassitude.  HEENT:   No headaches,  Difficulty swallowing,  Tooth/dental problems, or  Sore throat,                No sneezing, itching, ear ache,  =nasal congestion, post nasal drip,   CV:  No chest pain,  Orthopnea, PND, swelling in lower extremities, anasarca, dizziness, palpitations, syncope.   GI  No heartburn, indigestion, abdominal pain, nausea, vomiting, diarrhea, change in bowel habits, loss of appetite, bloody stools.   Resp:  .  No chest wall deformity  Skin: no rash or lesions.  GU: no dysuria, change in color of urine, no urgency  or frequency.  No flank pain, no hematuria   MS:  No joint pain or swelling.  No decreased range of motion.  No back pain.    Physical Exam  Pulse 65   Ht  (1.702 m)   Wt 198 lb 12.8 oz (90.2 kg)   SpO2 98%   BMI 31.14 kg/m   GEN: A/Ox3; pleasant , NAD, obese    HEENT:  [place]/AT,  EACs-clear, TMs-wnl, NOSE-clear drainage , THROAT-clear, no lesions, no postnasal drip or exudate noted.   NECK:  Supple w/ fair ROM; no JVD; normal carotid impulses w/o bruits; no thyromegaly or nodules palpated; no lymphadenopathy.    RESP  Clear  P & A; w/o, wheezes/ rales/ or rhonchi. no accessory muscle use, no dullness to percussion  CARD:  RRR, no m/r/g, no peripheral edema, pulses intact, no cyanosis or clubbing.  GI:   Soft & nt; nml bowel sounds; no organomegaly or masses detected.   Musco: Warm bil, no deformities or joint swelling noted.   Neuro: alert, no focal deficits noted.    Skin: Warm, no lesions or rashes    Lab Results:   BNP No results found for: BNP  ProBNP No results found for: PROBNP  Imaging: No results found.   Assessment & Plan:   Asthma in adult, moderate persistent, uncomplicated Asthma - no flare of cough /wheezing off maintenance inhalers. Pt education given  Discussed action plan to add Dulera back .  Control for triggers   Plan  Patient Instructions  Limit Advil use . - discuss with Primary elevated blood pressure .  May remain of [facility] for now, use if symptoms -wheezing /cough restart .  Use Albuterol As needed  , if start to use more than 2x a week, need to restart Dulera .  Claritin  daily As needed  Drainage  Continue on Singulair daily.  Follow up with Dr. Vassie Loll  In 4-6 months and As needed         Allergic rhinitis Control for triggers   Plan  Patient Instructions  Limit Advil use . - discuss with Primary elevated blood pressure .  May remain of [facility] for now, use if symptoms -wheezing /cough restart .  Use Albuterol As  needed  , if start to use more than 2x a week, need to restart Dulera .  Claritin  daily As needed  Drainage  Continue on Singulair daily.  Follow up with Dr. Vassie Loll  In 4-6 months and As needed            Rubye Oaks, NP 05/16/2018

## 2018-05-20 NOTE — Progress Notes (Signed)
Reviewed & agree with plan  

## 2020-03-10 ENCOUNTER — Other Ambulatory Visit: Payer: Self-pay | Admitting: Family Medicine

## 2020-03-10 DIAGNOSIS — Z1231 Encounter for screening mammogram for malignant neoplasm of breast: Secondary | ICD-10-CM

## 2020-03-29 ENCOUNTER — Ambulatory Visit
Admission: RE | Admit: 2020-03-29 | Discharge: 2020-03-29 | Disposition: A | Discharge status: Home / Self Care | Source: Ambulatory Visit | Attending: Family Medicine | Admitting: Family Medicine

## 2020-03-29 DIAGNOSIS — Z1231 Encounter for screening mammogram for malignant neoplasm of breast: Secondary | ICD-10-CM | POA: Diagnosis not present

## 2020-04-12 ENCOUNTER — Other Ambulatory Visit: Payer: Self-pay | Admitting: Family Medicine

## 2020-04-12 DIAGNOSIS — R921 Mammographic calcification found on diagnostic imaging of breast: Secondary | ICD-10-CM

## 2020-04-12 DIAGNOSIS — R928 Other abnormal and inconclusive findings on diagnostic imaging of breast: Secondary | ICD-10-CM

## 2020-04-12 DIAGNOSIS — N6489 Other specified disorders of breast: Secondary | ICD-10-CM

## 2020-04-15 ENCOUNTER — Ambulatory Visit
Admission: RE | Admit: 2020-04-15 | Discharge: 2020-04-15 | Disposition: A | Discharge status: Home / Self Care | Source: Ambulatory Visit | Attending: Family Medicine | Admitting: Family Medicine

## 2020-04-15 DIAGNOSIS — N6489 Other specified disorders of breast: Secondary | ICD-10-CM | POA: Insufficient documentation

## 2020-04-15 DIAGNOSIS — R921 Mammographic calcification found on diagnostic imaging of breast: Secondary | ICD-10-CM

## 2020-04-15 DIAGNOSIS — R928 Other abnormal and inconclusive findings on diagnostic imaging of breast: Secondary | ICD-10-CM | POA: Diagnosis not present

## 2021-06-14 ENCOUNTER — Other Ambulatory Visit: Payer: Self-pay | Admitting: Family Medicine

## 2021-06-14 DIAGNOSIS — Z1231 Encounter for screening mammogram for malignant neoplasm of breast: Secondary | ICD-10-CM

## 2021-06-21 ENCOUNTER — Other Ambulatory Visit: Payer: Self-pay

## 2021-06-21 ENCOUNTER — Ambulatory Visit
Admission: RE | Admit: 2021-06-21 | Discharge: 2021-06-21 | Disposition: A | Discharge status: Home / Self Care | Source: Ambulatory Visit | Attending: Family Medicine | Admitting: Family Medicine

## 2021-06-21 DIAGNOSIS — Z1231 Encounter for screening mammogram for malignant neoplasm of breast: Secondary | ICD-10-CM | POA: Diagnosis not present

## 2022-10-22 ENCOUNTER — Other Ambulatory Visit: Payer: Self-pay | Admitting: Family Medicine

## 2022-10-22 DIAGNOSIS — Z1231 Encounter for screening mammogram for malignant neoplasm of breast: Secondary | ICD-10-CM

## 2022-11-15 ENCOUNTER — Ambulatory Visit
Admission: RE | Admit: 2022-11-15 | Discharge: 2022-11-15 | Disposition: A | Discharge status: Home / Self Care | Source: Ambulatory Visit | Attending: Family Medicine | Admitting: Family Medicine

## 2022-11-15 DIAGNOSIS — Z1231 Encounter for screening mammogram for malignant neoplasm of breast: Secondary | ICD-10-CM | POA: Diagnosis present

## 2023-04-19 IMAGING — MG MM DIGITAL SCREENING BILAT W/ TOMO AND CAD
8 series · 8 of 24 positions shown · non-contrast
Comparison: Previous exam(s).

CLINICAL DATA: Screening.

EXAM:
DIGITAL SCREENING BILATERAL MAMMOGRAM WITH TOMOSYNTHESIS AND CAD
TECHNIQUE: Bilateral screening digital craniocaudal and mediolateral oblique
mammograms were obtained. Bilateral screening digital breast
tomosynthesis was performed. The images were evaluated with
computer-aided detection.

[R CC synth-2D]
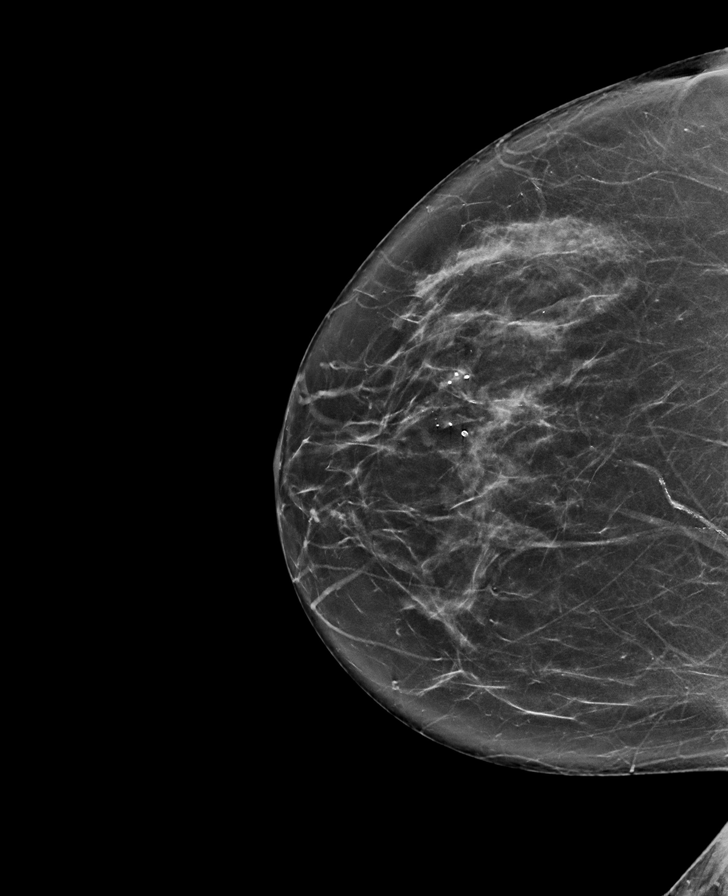

[L CC synth-2D]
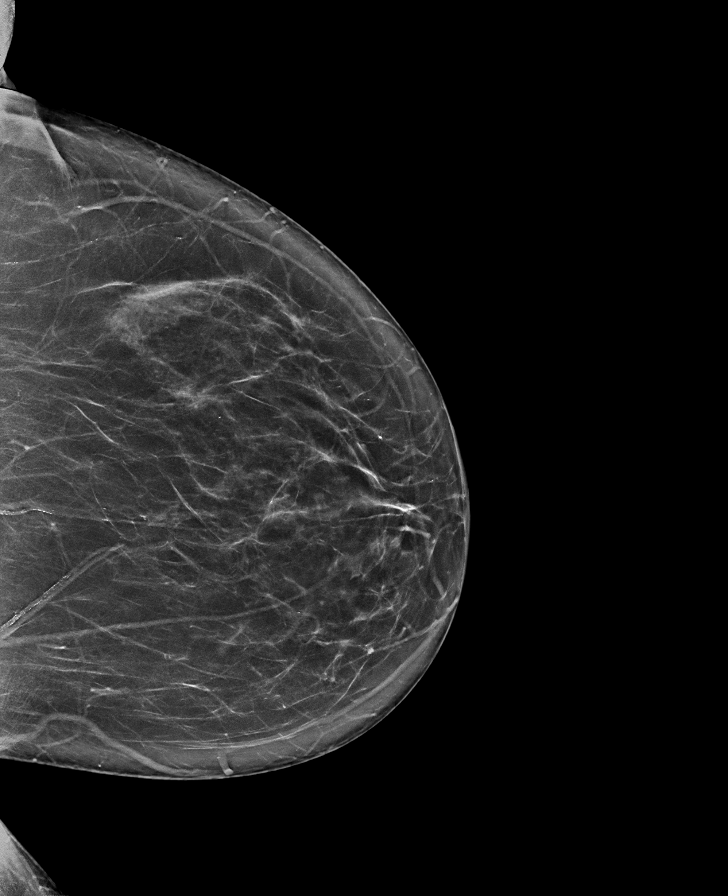

[R MLO synth-2D]
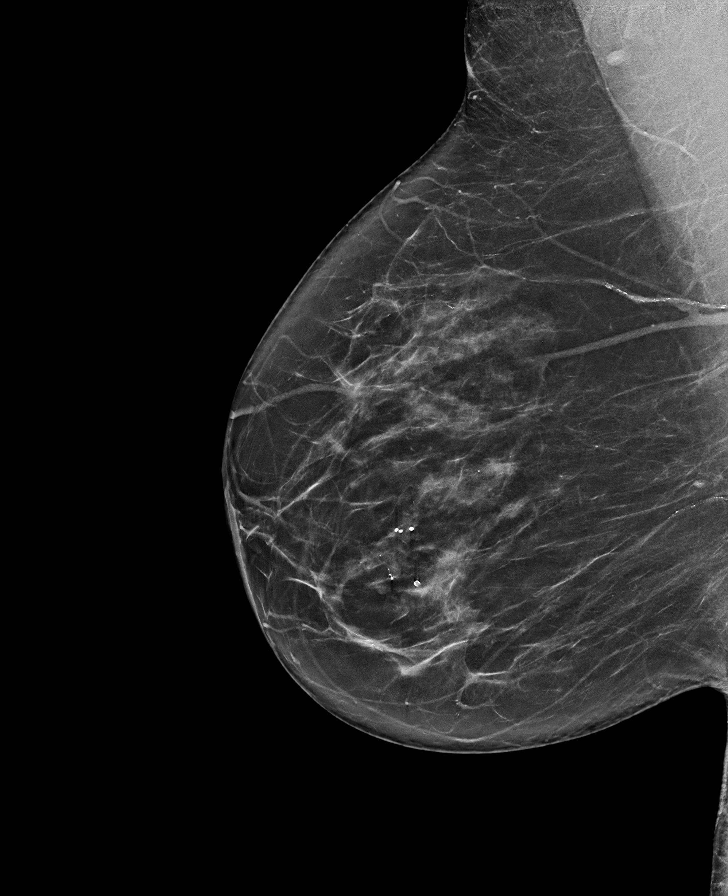

[L MLO synth-2D]
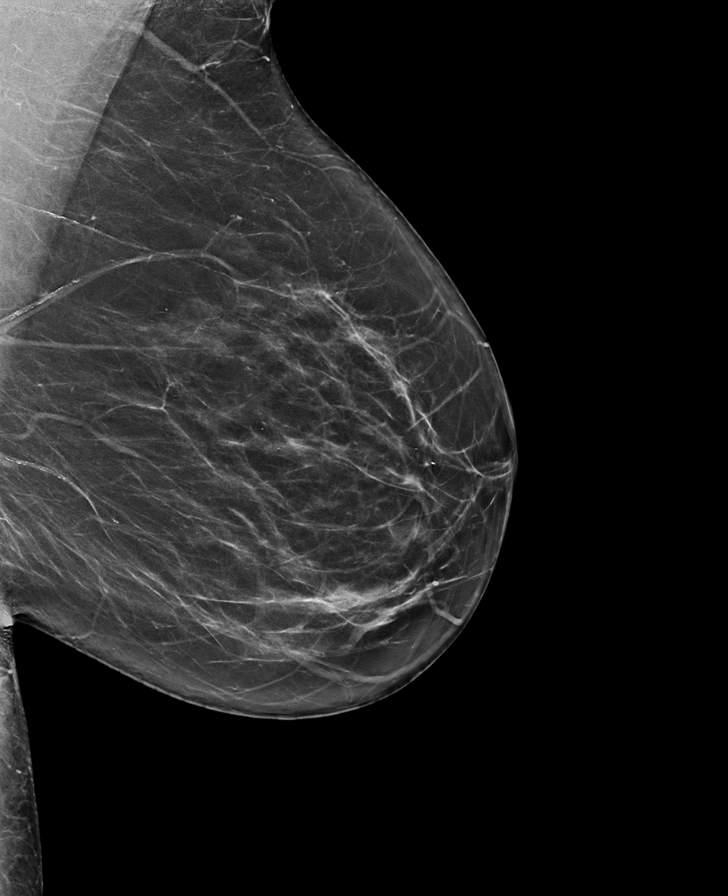

[L CC tomo · tomo slice 39/78.0]
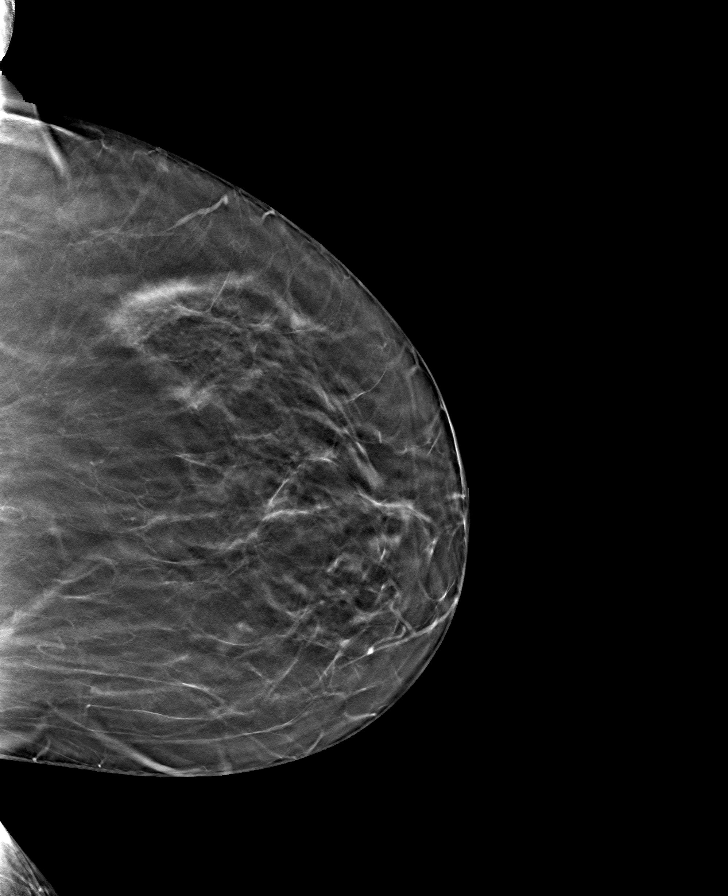

[L MLO tomo · tomo slice 37/73.0]
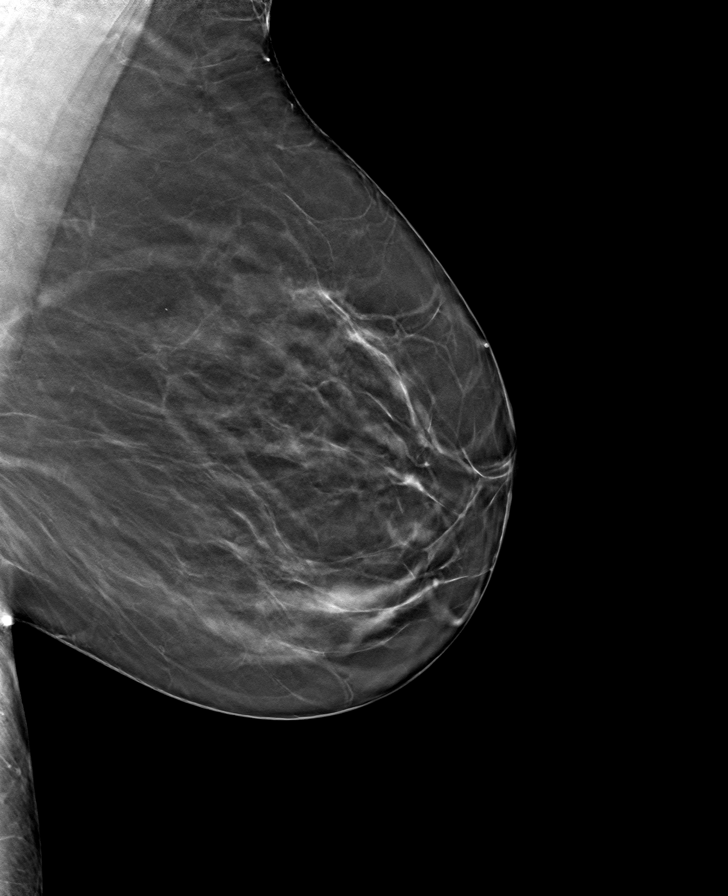

[R CC tomo · tomo slice 37/74.0]
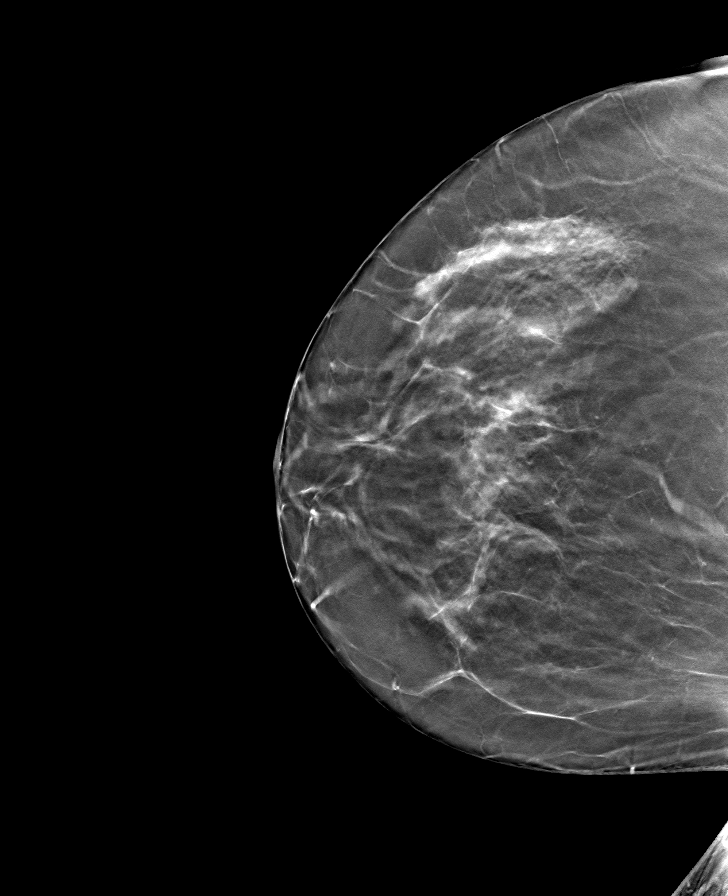

[R MLO tomo · tomo slice 40/79.0]
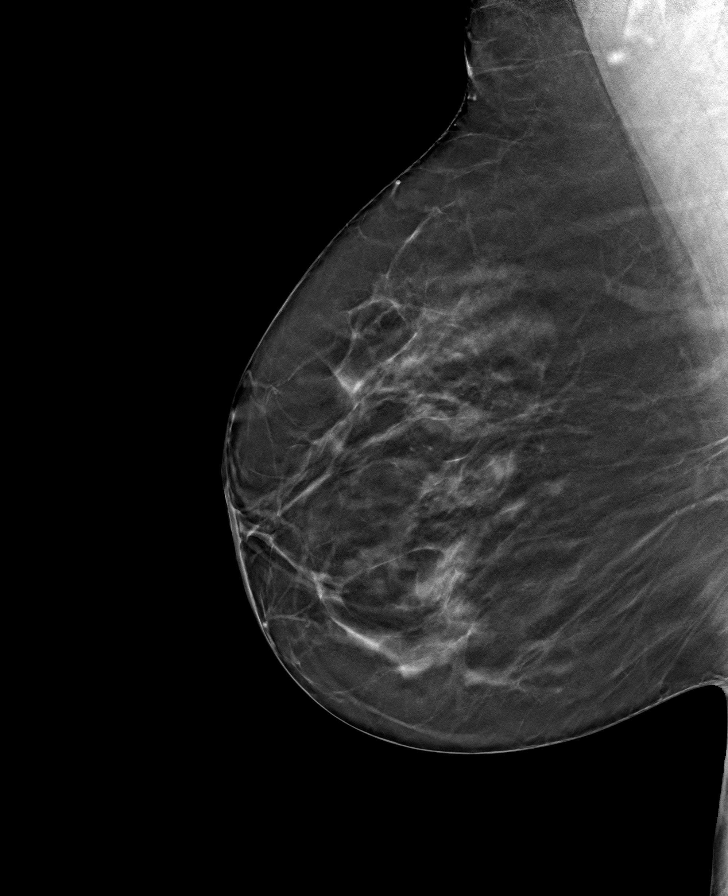

[8 of 24 positions shown; findings below may reference images not displayed]

ACR Breast Density Category b: There are scattered areas of
fibroglandular density.
FINDINGS: There are no findings suspicious for malignancy.
IMPRESSION: No mammographic evidence of malignancy. A result letter of this
screening mammogram will be mailed directly to the patient.

RECOMMENDATION:
Screening mammogram in one year. (Code:51-O-LD2)

BI-RADS CATEGORY  1: Negative.

## 2023-08-09 LAB — EXTERNAL GENERIC LAB PROCEDURE: COLOGUARD: NEGATIVE

## 2023-08-09 LAB — COLOGUARD: COLOGUARD: NEGATIVE

## 2023-10-10 ENCOUNTER — Other Ambulatory Visit: Payer: Self-pay | Admitting: Family Medicine

## 2023-10-10 DIAGNOSIS — Z1231 Encounter for screening mammogram for malignant neoplasm of breast: Secondary | ICD-10-CM

## 2023-11-19 ENCOUNTER — Ambulatory Visit
Admission: RE | Admit: 2023-11-19 | Discharge: 2023-11-19 | Disposition: A | Discharge status: Home / Self Care | Source: Ambulatory Visit | Attending: Family Medicine | Admitting: Family Medicine

## 2023-11-19 DIAGNOSIS — Z1231 Encounter for screening mammogram for malignant neoplasm of breast: Secondary | ICD-10-CM | POA: Insufficient documentation

## 2024-12-29 ENCOUNTER — Other Ambulatory Visit: Payer: Self-pay | Admitting: Family Medicine

## 2024-12-29 DIAGNOSIS — Z1231 Encounter for screening mammogram for malignant neoplasm of breast: Secondary | ICD-10-CM

## 2025-01-20 ENCOUNTER — Ambulatory Visit
Admission: RE | Admit: 2025-01-20 | Discharge: 2025-01-20 | Disposition: A | Discharge status: Home / Self Care | Source: Ambulatory Visit | Attending: Family Medicine | Admitting: Family Medicine

## 2025-01-20 DIAGNOSIS — Z1231 Encounter for screening mammogram for malignant neoplasm of breast: Secondary | ICD-10-CM | POA: Diagnosis present
